# Patient Record
Sex: Female | Born: 2005
Health system: Southern US, Community
[De-identification: ages and names within clinical notes are randomized; demographics above are authoritative.]

## PROBLEM LIST (undated history)

## (undated) DIAGNOSIS — F909 Attention-deficit hyperactivity disorder, unspecified type: Secondary | ICD-10-CM

## (undated) DIAGNOSIS — T148XXA Other injury of unspecified body region, initial encounter: Secondary | ICD-10-CM

## (undated) HISTORY — DX: Attention-deficit hyperactivity disorder, unspecified type: F90.9

## (undated) HISTORY — DX: Other injury of unspecified body region, initial encounter: T14.8XXA

---
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
## 2023-01-03 DIAGNOSIS — N390 Urinary tract infection, site not specified: Secondary | ICD-10-CM | POA: Diagnosis not present

## 2023-12-05 ENCOUNTER — Ambulatory Visit (INDEPENDENT_AMBULATORY_CARE_PROVIDER_SITE_OTHER): Payer: Self-pay | Admitting: Physician Assistant

## 2023-12-05 ENCOUNTER — Encounter: Payer: Self-pay | Admitting: Physician Assistant

## 2023-12-05 VITALS — BP 124/80 | HR 110 | Ht 61.0 in | Wt 104.0 lb

## 2023-12-05 DIAGNOSIS — Z1159 Encounter for screening for other viral diseases: Secondary | ICD-10-CM | POA: Diagnosis not present

## 2023-12-05 DIAGNOSIS — D508 Other iron deficiency anemias: Secondary | ICD-10-CM | POA: Insufficient documentation

## 2023-12-05 DIAGNOSIS — Z0000 Encounter for general adult medical examination without abnormal findings: Secondary | ICD-10-CM

## 2023-12-05 DIAGNOSIS — Z13220 Encounter for screening for lipoid disorders: Secondary | ICD-10-CM | POA: Diagnosis not present

## 2023-12-05 DIAGNOSIS — Z8659 Personal history of other mental and behavioral disorders: Secondary | ICD-10-CM | POA: Insufficient documentation

## 2023-12-05 DIAGNOSIS — Z131 Encounter for screening for diabetes mellitus: Secondary | ICD-10-CM | POA: Diagnosis not present

## 2023-12-05 NOTE — Progress Notes (Unsigned)
   Complete physical exam  Patient: Jill Benson   DOB: 2006-01-25   18 y.o. Female  MRN: [id number]  Subjective:     Chief Complaint  Patient presents with   Annual Exam    Jill Benson is a 18 y.o. female who presents today for a complete physical exam. She reports consuming a {diet types:17450} diet. {types:19826} She generally feels {DESC; WELL/FAIRLY WELL/POORLY:18703}. She reports sleeping {DESC; WELL/FAIRLY WELL/POORLY:18703}. She {does/does not:200015} have additional problems to discuss today.    Most recent fall risk assessment:    09/29/2020    8:32 AM  Fall Risk   Falls in the past year? 0  Number falls in past yr: 0  Injury with Fall? 0  Follow up Falls evaluation completed     Most recent depression screenings:    05/07/2021   12:08 PM 04/15/2021    6:58 AM  PHQ 2/9 Scores  PHQ - 2 Score 0 2  PHQ- 9 Score 2 6    {VISON DENTAL STD PSA (Optional):27386}  {History (Optional):23778}  Patient Care Team: Omario Ander L, PA-C as PCP - General (Family Medicine)   Outpatient Medications Prior to Visit  Medication Sig   FLUoxetine  (PROZAC ) 20 MG capsule Take 1 capsule (20 mg total) by mouth daily.   No facility-administered medications prior to visit.    ROS        Objective:     There were no vitals taken for this visit. {Vitals History (Optional):23777}  Physical Exam   No results found for any visits on 12/05/23. {Show previous labs (optional):23779}    Assessment & Plan:    Routine Health Maintenance and Physical Exam  Immunization History  Administered Date(s) Administered   DTaP / HiB / IPV 12/03/2003, 02/03/2004, 03/30/2004, 04/13/2005, 10/10/2008   HIB (PRP-T) 12/03/2003, 02/03/2004, 03/30/2004, 01/01/2005   HPV 9-valent 04/30/2020, 07/15/2020, 11/25/2020   Hepatitis A 10/04/2005, 04/15/2006   Hepatitis A, Ped/Adol-2 Dose 10/04/2005, 04/15/2006   Hepatitis B 09-22-2003, 11/04/2003, 07/03/2004   Hepatitis B, PED/ADOLESCENT  03/31/04, 11/04/2003, 07/03/2004   IPV 12/03/2003, 02/03/2004, 04/13/2005, 10/10/2008   Influenza-Unspecified 04/22/2004, 05/27/2004, 04/13/2005   MMR Sep 25, 2003, 10/10/2008   Meningococcal B, OMV 02/04/2021, 04/07/2021   Meningococcal Conjugate 03/19/2015   Meningococcal Mcv4o 02/04/2021   PFIZER Comirnaty(Gray Top)Covid-19 Tri-Sucrose Vaccine 10/13/2019, 11/05/2019   PFIZER(Purple Top)SARS-COV-2 Vaccination 10/13/2019, 11/05/2019   Pneumococcal Conjugate-13 12/03/2003, 02/03/2004, 03/30/2004, 10/02/2004   Tdap 03/19/2015   Varicella 10/02/2004, 10/10/2008    Health Maintenance  Topic Date Due   HIV Screening  Never done   Hepatitis C Screening  Never done   COVID-19 Vaccine (5 - 2024-25 season) 02/27/2023   INFLUENZA VACCINE  01/27/2024   DTaP/Tdap/Td (7 - Td or Tdap) 03/18/2025   Pneumococcal Vaccine 20-72 Years old  Completed   HPV VACCINES  Completed   Meningococcal B Vaccine  Completed    Discussed health benefits of physical activity, and encouraged her to engage in regular exercise appropriate for her age and condition.  Problem List Items Addressed This Visit   None  No follow-ups on file.     Jill Bourassa, PA-C

## 2023-12-05 NOTE — Patient Instructions (Signed)

## 2023-12-06 ENCOUNTER — Ambulatory Visit: Payer: Self-pay | Admitting: Physician Assistant

## 2023-12-06 ENCOUNTER — Encounter: Payer: Self-pay | Admitting: Physician Assistant

## 2023-12-06 LAB — CBC WITH DIFFERENTIAL/PLATELET
Basophils Absolute: 0 10*3/uL (ref 0.0–0.2)
Basos: 0 %
EOS (ABSOLUTE): 0.1 10*3/uL (ref 0.0–0.4)
Eos: 2 %
Hematocrit: 41.2 % (ref 34.0–46.6)
Hemoglobin: 13.7 g/dL (ref 11.1–15.9)
Immature Grans (Abs): 0 10*3/uL (ref 0.0–0.1)
Immature Granulocytes: 0 %
Lymphocytes Absolute: 1.3 10*3/uL (ref 0.7–3.1)
Lymphs: 19 %
MCH: 31.1 pg (ref 26.6–33.0)
MCHC: 33.3 g/dL (ref 31.5–35.7)
MCV: 93 fL (ref 79–97)
Monocytes Absolute: 0.5 10*3/uL (ref 0.1–0.9)
Monocytes: 8 %
Neutrophils Absolute: 4.9 10*3/uL (ref 1.4–7.0)
Neutrophils: 71 %
Platelets: 173 10*3/uL (ref 150–450)
RBC: 4.41 x10E6/uL (ref 3.77–5.28)
RDW: 11.8 % (ref 11.7–15.4)
WBC: 7 10*3/uL (ref 3.4–10.8)

## 2023-12-06 LAB — CMP14+EGFR
ALT: 14 IU/L (ref 0–32)
AST: 21 IU/L (ref 0–40)
Albumin: 4.8 g/dL (ref 4.0–5.0)
Alkaline Phosphatase: 59 IU/L (ref 42–106)
BUN/Creatinine Ratio: 10 (ref 9–23)
BUN: 9 mg/dL (ref 6–20)
Bilirubin Total: 0.9 mg/dL (ref 0.0–1.2)
CO2: 18 mmol/L — ABNORMAL LOW (ref 20–29)
Calcium: 9.8 mg/dL (ref 8.7–10.2)
Chloride: 106 mmol/L (ref 96–106)
Creatinine, Ser: 0.86 mg/dL (ref 0.57–1.00)
Globulin, Total: 2.1 g/dL (ref 1.5–4.5)
Glucose: 81 mg/dL (ref 70–99)
Potassium: 3.9 mmol/L (ref 3.5–5.2)
Sodium: 139 mmol/L (ref 134–144)
Total Protein: 6.9 g/dL (ref 6.0–8.5)
eGFR: 99 mL/min/{1.73_m2} (ref >59)

## 2023-12-06 LAB — VITAMIN D 25 HYDROXY (VIT D DEFICIENCY, FRACTURES): Vit D, 25-Hydroxy: 31.9 ng/mL (ref 30.0–100.0)

## 2023-12-06 LAB — HEPATITIS C ANTIBODY: Hep C Virus Ab: NONREACTIVE

## 2023-12-06 LAB — LIPID PANEL
Chol/HDL Ratio: 2.7 ratio (ref 0.0–4.4)
Cholesterol, Total: 129 mg/dL (ref 100–199)
HDL: 48 mg/dL (ref >39)
LDL Chol Calc (NIH): 68 mg/dL (ref 0–99)
Triglycerides: 59 mg/dL (ref 0–149)
VLDL Cholesterol Cal: 13 mg/dL (ref 5–40)

## 2023-12-06 LAB — TSH: TSH: 2.07 u[IU]/mL (ref 0.450–4.500)

## 2023-12-06 LAB — IRON,TIBC AND FERRITIN PANEL
Ferritin: 50 ng/mL (ref 15–150)
Iron Saturation: 24 % (ref 15–55)
Iron: 76 ug/dL (ref 27–159)
Total Iron Binding Capacity: 322 ug/dL (ref 250–450)
UIBC: 246 ug/dL (ref 131–425)

## 2023-12-06 NOTE — Progress Notes (Signed)
 Zayley,   Kidney, liver, glucose looks great.  Hemoglobin normal and iron stores look good.  Cholesterol looks fantastic.  Thyroid normal.  Vitamin D low normal. Make sure taking at least 1000 units every day!   GREAT labs!
